# Patient Record
Sex: Female | Born: 1960 | Race: White | Hispanic: No | Marital: Single | Smoking: Current every day smoker
Health system: Southern US, Community
[De-identification: ages and names within clinical notes are randomized; demographics above are authoritative.]

---

## 2009-10-02 ENCOUNTER — Ambulatory Visit: Payer: Self-pay | Admitting: Obstetrics and Gynecology

## 2010-11-28 ENCOUNTER — Ambulatory Visit: Payer: Self-pay | Admitting: Obstetrics and Gynecology

## 2011-12-01 ENCOUNTER — Ambulatory Visit: Payer: Self-pay | Admitting: Obstetrics and Gynecology

## 2012-12-01 ENCOUNTER — Ambulatory Visit: Payer: Self-pay | Admitting: Obstetrics and Gynecology

## 2014-10-23 ENCOUNTER — Ambulatory Visit: Payer: Self-pay | Admitting: Family Medicine

## 2016-02-11 IMAGING — CR DG CHEST 2V
2 series · 2 of 2 positions shown · non-contrast
Comparison: None.

CLINICAL DATA: Shortness of breath. Cough. Chest pain. Emphysema.

EXAM:
CHEST  2 VIEW

[chest pa]
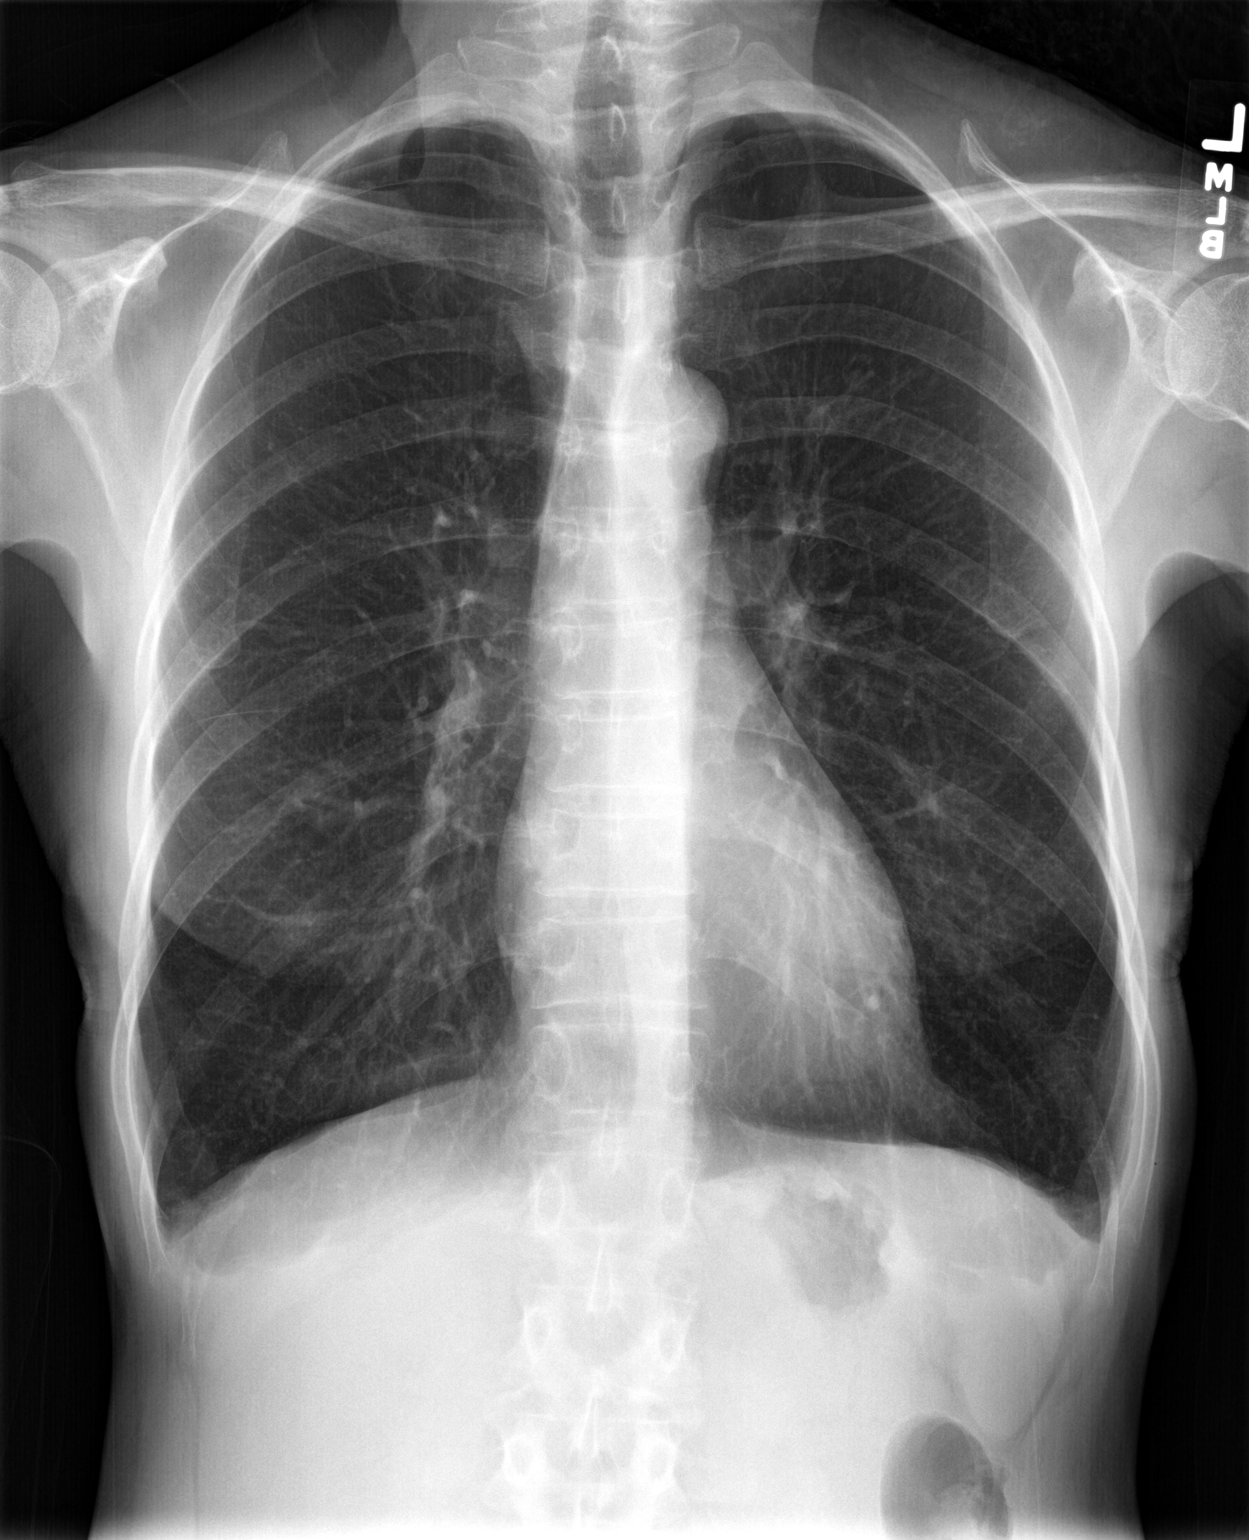

[chest lat]
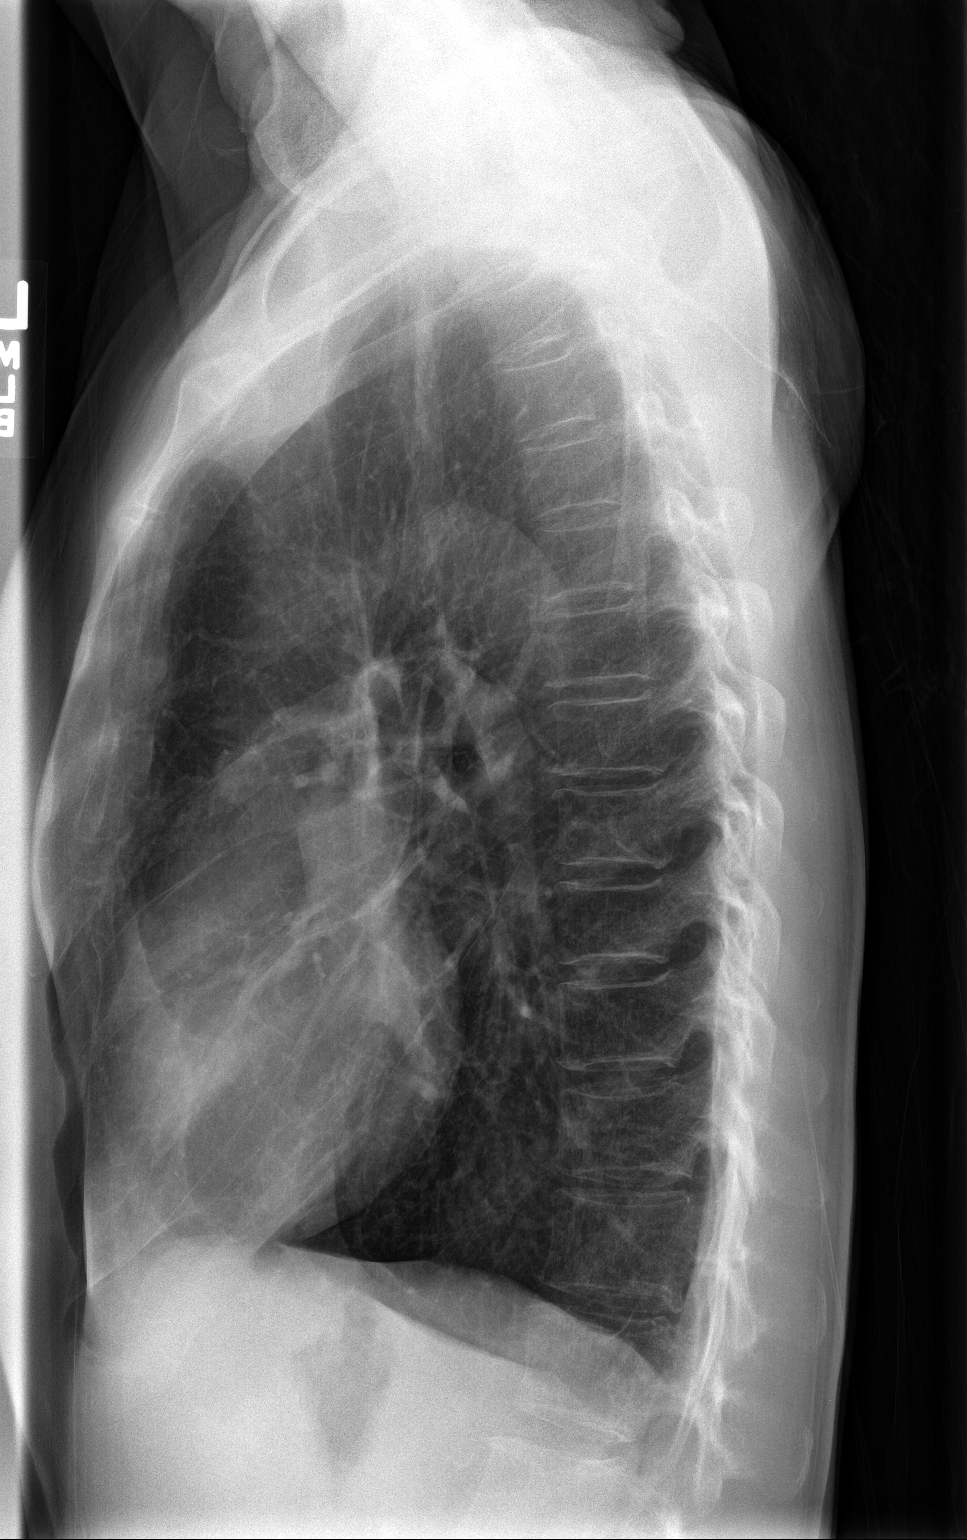

[2 of 2 positions shown; findings below may reference images not displayed]

FINDINGS: The heart size and pulmonary vascularity are normal. The lungs are
hyperinflated with flattening of the diaphragm consistent with
emphysema.

There are no infiltrates or effusions.  No osseous abnormality.
IMPRESSION: No acute abnormality.  Emphysema.

## 2019-03-17 ENCOUNTER — Ambulatory Visit (INDEPENDENT_AMBULATORY_CARE_PROVIDER_SITE_OTHER): Payer: Managed Care, Other (non HMO)

## 2019-03-17 ENCOUNTER — Ambulatory Visit
Admission: EM | Admit: 2019-03-17 | Discharge: 2019-03-17 | Disposition: A | Discharge status: Home / Self Care | Payer: Managed Care, Other (non HMO) | Attending: Urgent Care | Admitting: Urgent Care

## 2019-03-17 ENCOUNTER — Other Ambulatory Visit: Payer: Self-pay

## 2019-03-17 ENCOUNTER — Encounter: Payer: Self-pay | Admitting: Emergency Medicine

## 2019-03-17 DIAGNOSIS — H9203 Otalgia, bilateral: Secondary | ICD-10-CM | POA: Diagnosis not present

## 2019-03-17 DIAGNOSIS — M79671 Pain in right foot: Secondary | ICD-10-CM

## 2019-03-17 DIAGNOSIS — M7731 Calcaneal spur, right foot: Secondary | ICD-10-CM

## 2019-03-17 HISTORY — DX: Calcaneal spur, right foot: M77.31

## 2019-03-17 MED ORDER — METHYLPREDNISOLONE 4 MG PO TBPK: ORAL_TABLET | ORAL | 0 refills | Status: DC

## 2019-03-17 NOTE — ED Provider Notes (Signed)
[place], [place]   Name: Lori Prince DOB: 07/11/1961 MRN: [id number] CSN: [id number] PCP: Patient, No Pcp Per  Arrival date and time:  03/17/19 1018  Chief Complaint:  Otalgia and Foot Pain   NOTE: Prior to seeing the patient today, I have reviewed the triage nursing documentation and vital signs. Clinical staff has updated patient's PMH/PSHx, current medication list, and drug allergies/intolerances to ensure comprehensive history available to assist in medical decision making.   History:   HPI: Lori Prince is a 58 y.o. female who presents today with complaints of BILATERAL ear pain and RIGHT foot pain.   Ears: Pain in her ears started this morning. She denies any associated fevers, however felt like she was having some chills this morning. Patient has not had any other recent upper respiratory symptoms; no cough, congestion, rhinorrhea, sneezing, or sore throat. She denies forceful nose blowing. Patient has not appreciated any otorrhea. She advises that her ability to hear from her ears is normal. Patient denies history of recurrent ear infections. PMH (+) for seasonal allergies, however she does not take daily allergy medication.   Foot: Pain began with acute onset x 2 weeks ago. She notes that the majority of her pain is overlying the posterior aspect of the posterior calcaneous. She denies any injuries to her foot. She has not experienced any swelling. Patient advises that the pain improves after a hot shower. She takes the occasion dose of Tylenol, which has helped "some". She denies any previous injuries to the foot; no previous surgeries.   History reviewed. No pertinent past medical history.  History reviewed. No pertinent surgical history.  History reviewed. No pertinent family history.  Social History   Tobacco Use  . Smoking status: Former Games developermoker  . Smokeless tobacco: Never Used  Substance Use Topics  . Alcohol use: Never    Frequency: Never  . Drug  use: Never    There are no active problems to display for this patient.   Home Medications:    No outpatient medications have been marked as taking for the 03/17/19 encounter [facility](Hospital Encounter).    Allergies:   Patient has no known allergies.  Review of Systems (ROS): Review of Systems  Constitutional: Positive for chills. Negative for fever.  HENT: Positive for ear pain. Negative for congestion, ear discharge, rhinorrhea, sinus pain, sore throat and tinnitus.   Respiratory: Negative for cough and shortness of breath.   Cardiovascular: Negative for chest pain and palpitations.  Musculoskeletal:       RIGHT foot pain  Skin: Negative for color change, pallor and rash.  Neurological: Negative for dizziness, syncope, weakness and headaches.  Hematological: Negative for adenopathy.  All other systems reviewed and are negative.    Vital Signs: Today's Vitals   03/17/19 1110 03/17/19 1112  BP:  136/75  Pulse:  77  Resp:  18  Temp:  98.3 F (36.8 C)  TempSrc:  Oral  SpO2:  97%  Weight:  155 lb (70.3 kg)  Height:  5\' 4"  (1.626 m)  PainSc: 10-Worst pain ever     Physical Exam: Physical Exam  Constitutional: She is oriented to person, place, and time and well-developed, well-nourished, and in no distress.  HENT:  Head: Normocephalic and atraumatic.  Right Ear: Hearing, external ear and ear canal normal. No swelling or tenderness. Tympanic membrane is not injected, not erythematous and not bulging. A middle ear effusion (mild serous) is present.  Left Ear: Hearing, external ear and ear canal normal.  No swelling or tenderness. Tympanic membrane is not injected, not erythematous and not bulging. A middle ear effusion (mild serous) is present.  Nose: No mucosal edema, rhinorrhea or sinus tenderness.  Mouth/Throat: Uvula is midline, oropharynx is clear and moist and mucous membranes are normal.  Cardiovascular: Normal rate, regular rhythm, normal heart sounds and intact distal  pulses. Exam reveals no gallop and no friction rub.  No murmur heard. Pulmonary/Chest: Effort normal and breath sounds normal. No respiratory distress. She has no wheezes. She has no rales.  Musculoskeletal:     Right foot: Decreased range of motion (2/2 pain). Tenderness (marked areas) and swelling (slight) present. No deformity.       Feet:  Neurological: She is alert and oriented to person, place, and time. Gait normal. GCS score is 15.  Skin: Skin is warm and dry. No rash noted.  Psychiatric: Mood, memory, affect and judgment normal.  Nursing note and vitals reviewed.   Urgent Care Treatments / Results:   LABS: PLEASE NOTE: all labs that were ordered this encounter are listed, however only abnormal results are displayed. Labs Reviewed - No data to display  EKG: -None  RADIOLOGY: Dg Foot Complete Right  Result Date: 03/17/2019 CLINICAL DATA:  Plantar and lateral heel pain for 2 weeks with swelling. No known injury. EXAM: RIGHT FOOT COMPLETE - 3+ VIEW COMPARISON:  None. FINDINGS: No fracture or dislocation is identified. There is a small plantar calcaneal enthesophyte. No osseous erosion is identified. The soft tissues are unremarkable. IMPRESSION: No evidence of acute osseous abnormality. Small plantar calcaneal enthesophyte. Electronically Signed   By: Logan Bores M.D.   On: 03/17/2019 11:45   PROCEDURES: Procedures  MEDICATIONS RECEIVED THIS VISIT: Medications - No data to display  PERTINENT CLINICAL COURSE NOTES/UPDATES:   Initial Impression / Assessment and Plan / Urgent Care Course:  Pertinent labs & imaging results that were available during my care of the patient were personally reviewed by me and considered in my medical decision making (see lab/imaging section of note for values and interpretations).  Lori Prince is a 58 y.o. female who presents to [facility] today with complaints of Otalgia and Foot Pain   Patient is well appearing overall in  clinic today. She does not appear to be in any acute distress. Presenting symptoms (see HPI) and exam as documented above. Diagnostic radiographs of the RIGHT foot demonstrate a small plantar enthesophyte corresponding to the area of her pain on the plantar surface of the foot. Her posterior heel pain is also likely related to an insertional enthesitis. Discussed ice and stretching to help improve her pain. Patient to use IBU at home for inflammation. Will prescribe a course of systemic steroids (Medrol) to help with there pain, slight swelling, and overall inflammation. If not improving with theses measures, patient will need to see podiatry to discuss further interventions. Name and office contact information provided on today's AVS for Dr. Albertine Patricia. Patient advised the she will need to contact the office to schedule an appointment to be seen.   Regarding her ears. She has a mild serous effusions to her BILATERAL ears,  which are felt to be allergic in nature. Recommend conservative care with OTC allergy medications such as loratadine or cetirizine. Patient encouraged to remain well hydrated. May use Tylenol and/or Ibuprofen as needed for discomfort. If not improving, she was asked to return a call to the clinic, or to follow up for reassessment with her PCP.   I have  reviewed the follow up and strict return precautions for any new or worsening symptoms. Patient is aware of symptoms that would be deemed urgent/emergent, and would thus require further evaluation either here or in the emergency department. At the time of discharge, she verbalized understanding and consent with the discharge plan as it was reviewed with her. All questions were fielded by provider and/or clinic staff prior to patient discharge.    Final Clinical Impressions / Urgent Care Diagnoses:   Final diagnoses:  Pain of right heel  Otalgia of both ears    New Prescriptions:  [place] Controlled Substance Registry consulted? Not  Applicable  Meds ordered this encounter  Medications  . methylPREDNISolone (MEDROL DOSEPAK) 4 MG TBPK tablet    Sig: Take by mouth daily - taper daily dose per package instructions.    Dispense:  21 tablet    Refill:  0    Recommended Follow up Care:  Patient encouraged to follow up with the following provider within the specified time frame, or sooner as dictated by the severity of her symptoms. As always, she was instructed that for any urgent/emergent care needs, she should seek care either here or in the emergency department for more immediate evaluation.  Follow-up Information    PCP In 1 week.   Why: General reassessment of symptoms if not improving       Call  Troxler, Blanchie ServeMatthew, DPM.   Specialty: Podiatry Why: General reassessment of symptoms if not improving Contact information: [address] [facility] [place] [id number] [phone]         NOTE: This note was prepared using Dragon dictation software along with smaller phrase technology. Despite my best ability to proofread, there is the potential that transcriptional errors may still occur from this process, and are completely unintentional.    Verlee MonteGray, Tereasa Yilmaz E, NP 03/18/19 1742

## 2019-03-17 NOTE — ED Triage Notes (Signed)
Patient c/o right foot pain, heel area x 3-4 weeks. She denies injury. She also reports right ear pain that started yesterday. Patient states she woke up with chills this morning.

## 2019-03-17 NOTE — Discharge Instructions (Signed)
It was very nice seeing you today in clinic. Thank you for entrusting me with your care.   Please utilize the medications that we discussed. Your prescriptions have been called in to your pharmacy.   Make arrangements to follow up with your regular doctor in 1 week for re-evaluation if not improving. I have also given you the name of an excellent local podiatrist to contact if pain in foot is not improving. If your symptoms/condition worsens, please seek follow up care either here or in the ER. Please remember, our [place] providers are "right here with you" when you need [place].   Again, it was my pleasure to take care of you today. Thank you for choosing our clinic. I hope that you start to feel better quickly.   Honor Loh, MSN, APRN, FNP-C, CEN Advanced Practice Provider [facility]

## 2019-10-26 ENCOUNTER — Encounter: Payer: Self-pay | Admitting: Emergency Medicine

## 2019-10-26 ENCOUNTER — Ambulatory Visit
Admission: EM | Admit: 2019-10-26 | Discharge: 2019-10-26 | Disposition: A | Discharge status: Home / Self Care | Payer: Managed Care, Other (non HMO) | Attending: Urgent Care | Admitting: Urgent Care

## 2019-10-26 ENCOUNTER — Ambulatory Visit (INDEPENDENT_AMBULATORY_CARE_PROVIDER_SITE_OTHER): Payer: Managed Care, Other (non HMO)

## 2019-10-26 ENCOUNTER — Other Ambulatory Visit: Payer: Self-pay

## 2019-10-26 DIAGNOSIS — M79672 Pain in left foot: Secondary | ICD-10-CM

## 2019-10-26 MED ORDER — MELOXICAM 15 MG PO TABS: 15.0000 mg | ORAL_TABLET | Freq: Every day | ORAL | 0 refills | Status: AC

## 2019-10-26 NOTE — Discharge Instructions (Addendum)
It was very nice seeing you today in clinic. Thank you for entrusting me with your care.   Rest, ice, and elevate your foot. Use anti-inflammatory medications as prescribed. If not improving, recommend seeing podiatry to discuss further. I have provided you the name and office contact information for an excellent local provider.   If your symptoms/condition worsens, please seek follow up care either here or in the ER. Please remember, our [facility] providers are "right here with you" when you need [place].   Again, it was my pleasure to take care of you today. Thank you for choosing our clinic. I hope that you start to feel better quickly.   Quentin Mulling, MSN, APRN, FNP-C, CEN Advanced Practice Provider [facility] [place] [facility]

## 2019-10-26 NOTE — ED Triage Notes (Signed)
Patient c/o spasms on the bottom of her left foot x 3 weeks. She states her foot is painful when she walks.

## 2019-10-27 ENCOUNTER — Encounter: Payer: Self-pay | Admitting: Urgent Care

## 2019-10-27 NOTE — ED Provider Notes (Signed)
[place], [place]   Name: Lori Prince DOB: [birth year] MRN: [id number] CSN: [id number] PCP: Patient, No Pcp Per  Arrival date and time:  10/26/19 1834  Chief Complaint:  Foot Pain  NOTE: Prior to seeing the patient today, I have reviewed the triage nursing documentation and vital signs. Clinical staff has updated patient's PMH/PSHx, current medication list, and drug allergies/intolerances to ensure comprehensive history available to assist in medical decision making.   History:   HPI: Lori Prince is a 59 y.o. female who presents today with complaints of atraumatic pain to her LEFT foot. Pain has been worsening over the course of the last 3 weeks. Patient stands and walks a great deal at her job. She notes that pain is worse with prolonged standing and distance ambulation. Pain is described as a "spasm" type pain overlying the distal aspects of metatarsals three, four, and five. She denies erythema, ecchymosis, swelling, or warmth. Of note, patient reports blunt force trauma injury about 3-4 months ago whereby she dropped a vacuum on her foot. While her foot was bruised and painful for quite sometime, she did not seek medical attention at that time. Patient denies previous significant injuries to her foot or ankle; no surgeries. In efforts to conservatively manage her symptoms at home, the patient notes that she has used Aleve, which has not helped to improve her symptoms. Patient has a known calcaneal (plantar) spur contralaterally.   Past Medical History:  Diagnosis Date  . 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 No pertinent surgical history.  History reviewed. No pertinent family history.  Social History   Tobacco Use  . Smoking status: Former Research scientist (life sciences)  . Smokeless tobacco: Never Used  Substance Use Topics  . Alcohol use: Never  . Drug use: Never    There are no problems to display for this patient.   Home Medications:    No outpatient  medications have been marked as taking for the 10/26/19 encounter [facility] Encounter).    Allergies:   Patient has no known allergies.  Review of Systems (ROS):  Review of systems NEGATIVE unless otherwise noted in narrative H&P section.   Vital Signs: Today's Vitals   10/26/19 1848 10/26/19 1849 10/26/19 1938  BP:  (!) 148/75   Pulse:  65   Resp:  18   Temp:  97.9 F (36.6 C)   TempSrc:  Oral   SpO2:  100%   Weight: 150 lb (68 kg)    Height: 5\' 3"  (1.6 m)    PainSc: 10-Worst pain ever  10-Worst pain ever    Physical Exam: Physical Exam  Constitutional: She is oriented to person, place, and time and well-developed, well-nourished, and in no distress.  HENT:  Head: Normocephalic and atraumatic.  Eyes: Pupils are equal, round, and reactive to light.  Cardiovascular: Normal rate, regular rhythm, normal heart sounds and intact distal pulses.  Pulmonary/Chest: Effort normal and breath sounds normal.  Musculoskeletal:     Left foot: Normal range of motion and normal capillary refill. Tenderness (see marked location) present. No swelling, deformity, bony tenderness or crepitus.       Feet:  Neurological: She is alert and oriented to person, place, and time. Gait (2/2 foot pain) abnormal.  Skin: Skin is warm and dry. No rash noted. She is not diaphoretic.  Psychiatric: Mood, memory, affect and judgment normal.  Nursing note and vitals reviewed.   Urgent Care Treatments / Results:   Orders Placed This Encounter  Procedures  . DG Foot Complete Left    LABS: PLEASE NOTE: all labs that were ordered this encounter are listed, however only abnormal results are displayed. Labs Reviewed - No data to display  EKG: -None  RADIOLOGY: DG Foot Complete Left  Result Date: 10/26/2019 CLINICAL DATA:  Pain and swelling along the dorsal aspect of the left foot. EXAM: LEFT FOOT - COMPLETE 3+ VIEW COMPARISON:  None. FINDINGS: There is no evidence of fracture or dislocation. There is  no evidence of arthropathy or other focal bone abnormality. Soft tissues are unremarkable. IMPRESSION: Negative. Electronically Signed   By: Aram Candela M.D.   On: 10/26/2019 19:26    PROCEDURES: Procedures  MEDICATIONS RECEIVED THIS VISIT: Medications - No data to display  PERTINENT CLINICAL COURSE NOTES/UPDATES:   Initial Impression / Assessment and Plan / Urgent Care Course:  Pertinent labs & imaging results that were available during my care of the patient were personally reviewed by me and considered in my medical decision making (see lab/imaging section of note for values and interpretations).  Lori Prince is a 59 y.o. female who presents to [facility] today with complaints of Foot Pain  Patient is well appearing overall in clinic today. She does not appear to be in any acute distress. Presenting symptoms (see HPI) and exam as documented above. Pain present x 3 weeks. She denies injury. Diagnostic radiographs of the LEFT foot revealed no acute abnormalities; no fracture, dislocation, or effusion. There is no clear etiology for patient's foot pain. Suspect metatarsalgia secondary to prolonged standing and distance ambulation. Recommended rest, ice, and elevation. Will treat with NSAIDs (meloxicam). If not improving with conservative management, patient will need to be seen for further evaluation by podiatry. Name and office contact information provided on today's AVS for Dr. Recardo Evangelist. Patient advised the she will need to contact the office to schedule an appointment to be seen.   Current clinical condition warrants patient being out of work in order to recover from her current injury/illness. She reports that she does not wish to be able of work. She was provided with the appropriate documentation to provide to her place of employment that will allow for her to RTW on 10/27/2019 with no restrictions.   I have reviewed the follow up and strict return precautions  for any new or worsening symptoms. Patient is aware of symptoms that would be deemed urgent/emergent, and would thus require further evaluation either here or in the emergency department. At the time of discharge, she verbalized understanding and consent with the discharge plan as it was reviewed with her. All questions were fielded by provider and/or clinic staff prior to patient discharge.    Final Clinical Impressions / Urgent Care Diagnoses:   Final diagnoses:  Foot pain, left    New Prescriptions:  [place] Controlled Substance Registry consulted? Not Applicable  Meds ordered this encounter  Medications  . meloxicam (MOBIC) 15 MG tablet    Sig: Take 1 tablet (15 mg total) by mouth daily.    Dispense:  30 tablet    Refill:  0    Recommended Follow up Care:  Patient encouraged to follow up with the following provider within the specified time frame, or sooner as dictated by the severity of her symptoms. As always, she was instructed that for any urgent/emergent care needs, she should seek care either here or in the emergency department for more immediate evaluation.  Follow-up Information    Troxler, Molli Hazard, DPM In  1 week.   Specialty: Podiatry Why: General reassessment of symptoms if not improving Contact information: [address] [facility] [place] [address] [phone]         NOTE: This note was prepared using Dragon dictation software along with smaller phrase technology. Despite my best ability to proofread, there is the potential that transcriptional errors may still occur from this process, and are completely unintentional.    Verlee Monte, NP 10/27/19 [phone]

## 2021-02-13 IMAGING — CR DG FOOT COMPLETE 3+V*L*
3 series · 3 of 3 positions shown · non-contrast
Comparison: None.

CLINICAL DATA: Pain and swelling along the dorsal aspect of the
left foot.

EXAM:
LEFT FOOT - COMPLETE 3+ VIEW

[foot ap]
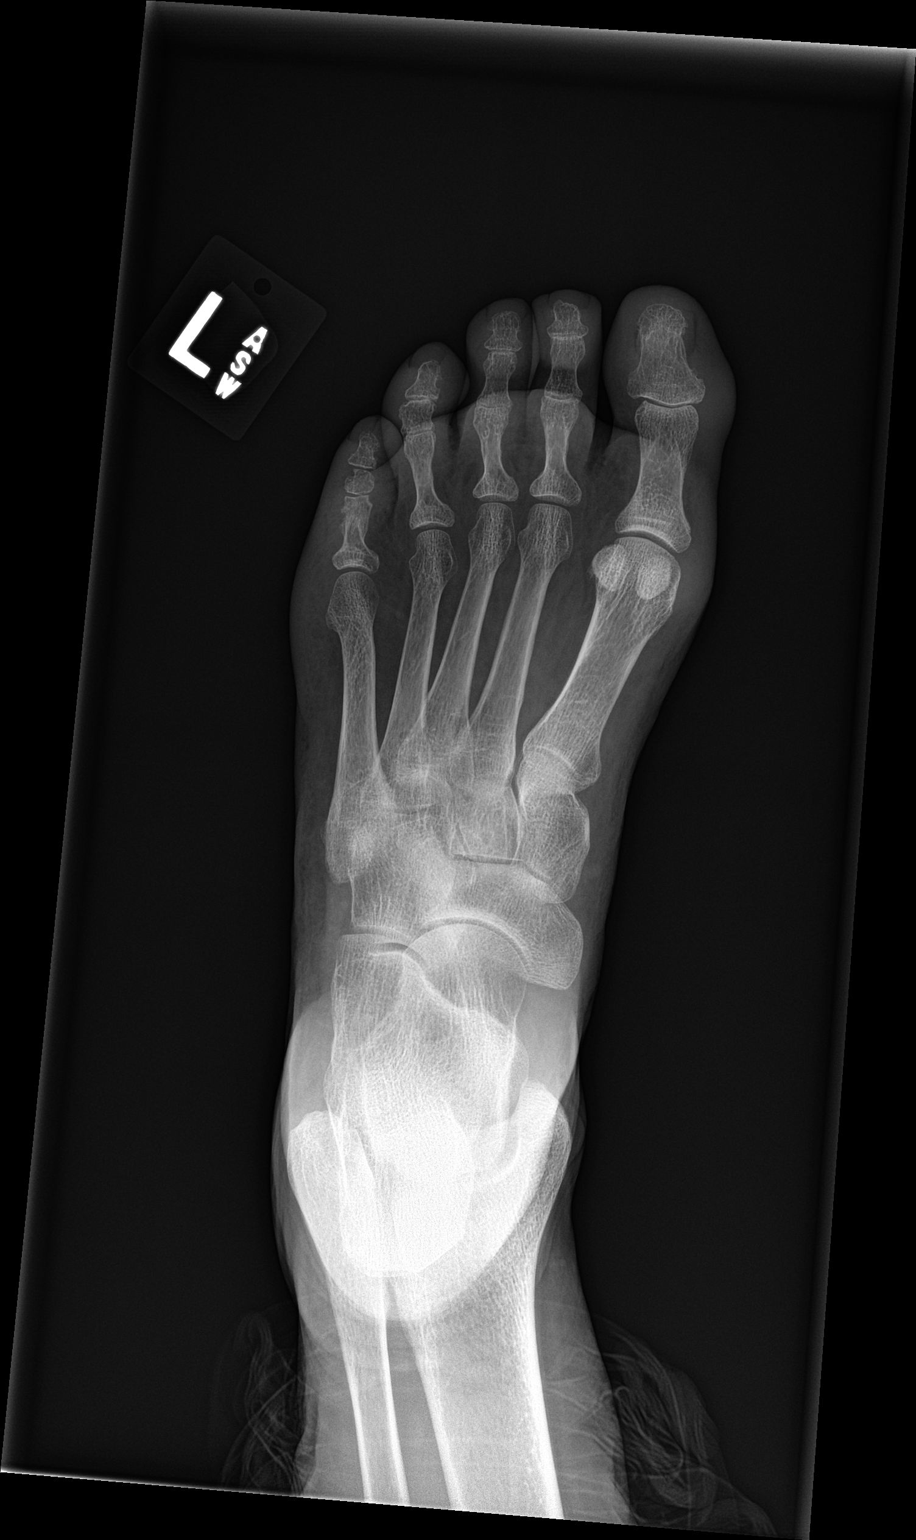

[foot obl]
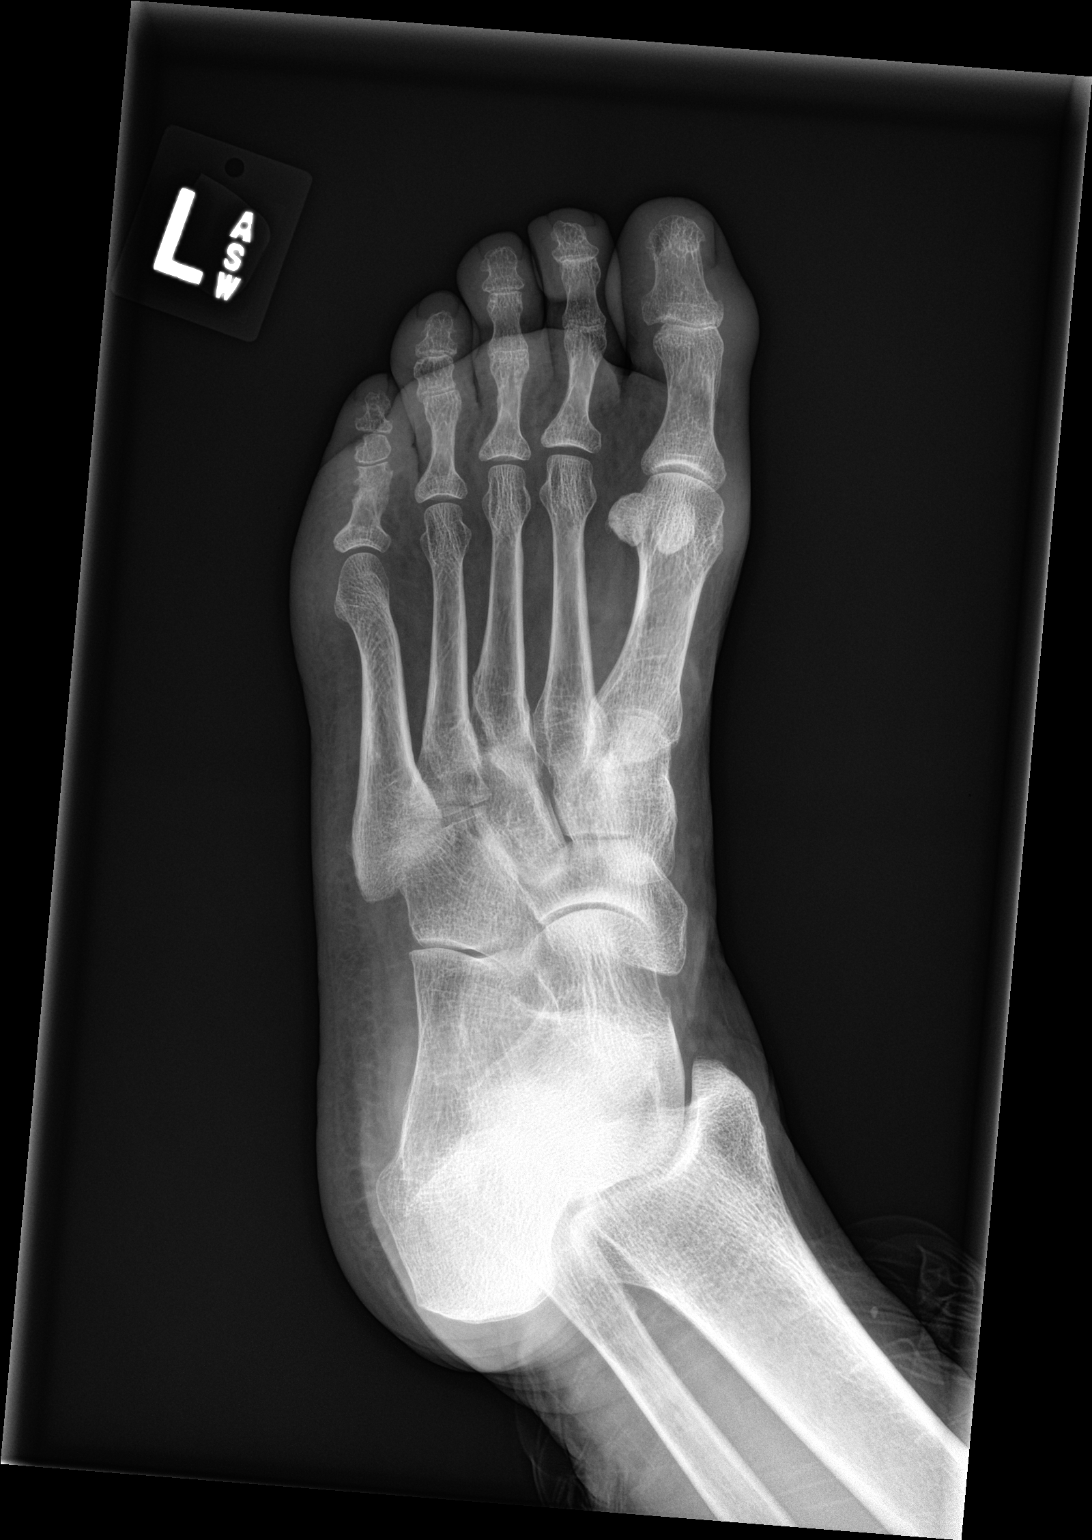

[foot lat]
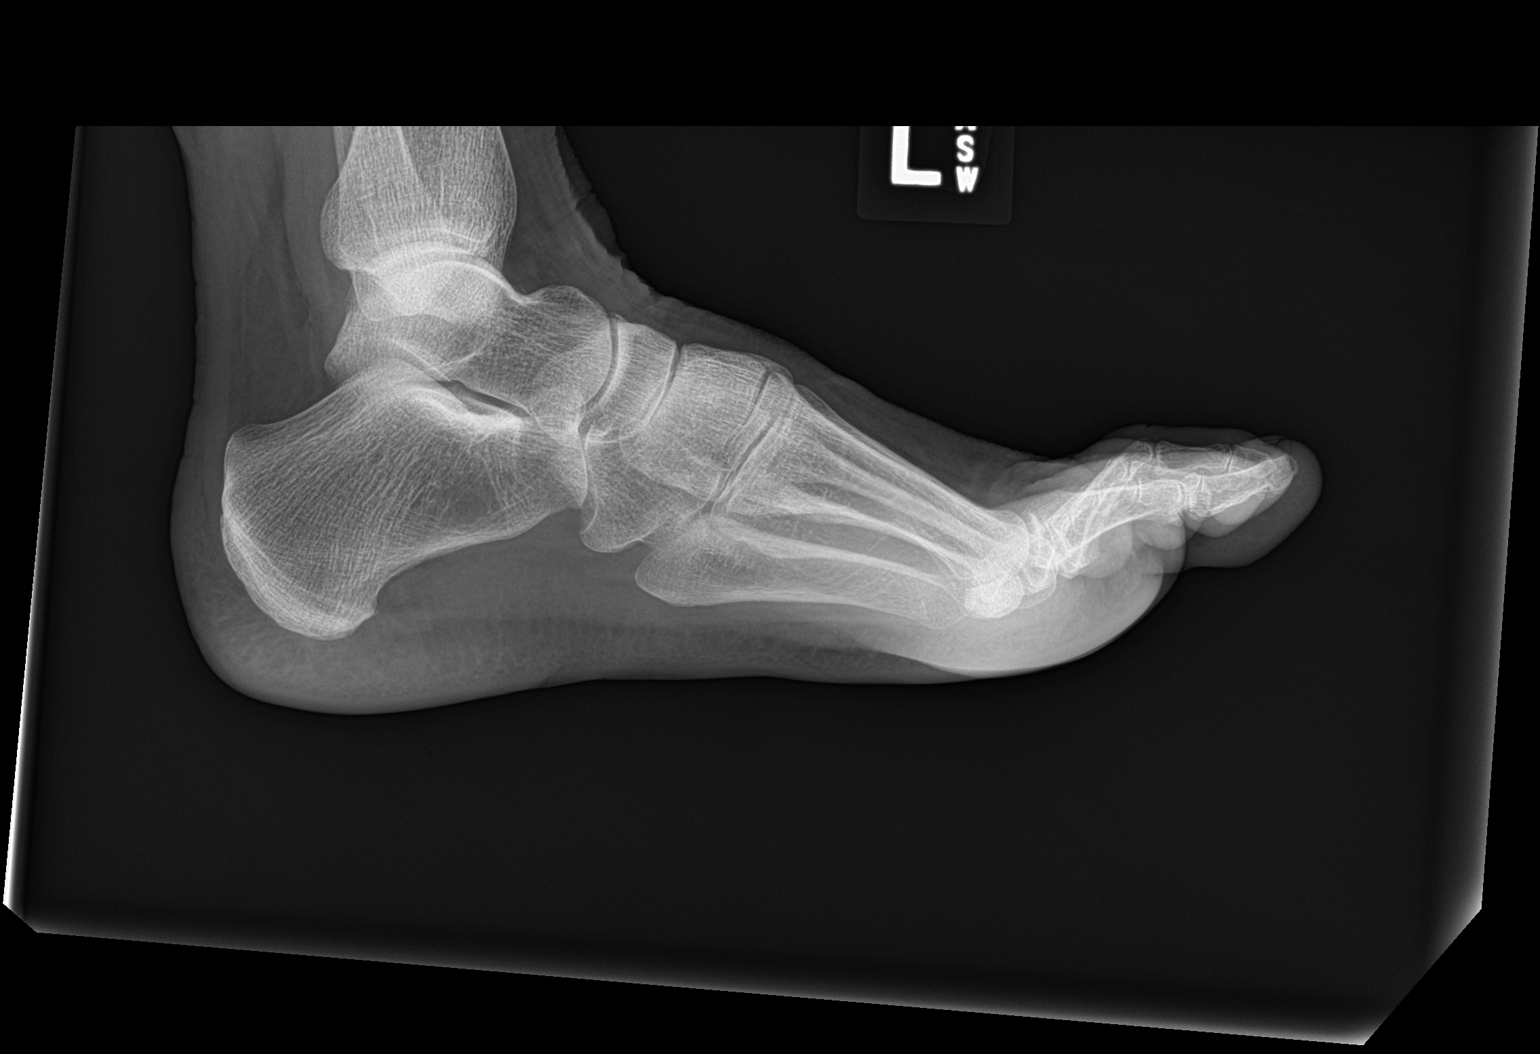

[3 of 3 positions shown; findings below may reference images not displayed]

FINDINGS: There is no evidence of fracture or dislocation. There is no
evidence of arthropathy or other focal bone abnormality. Soft
tissues are unremarkable.
IMPRESSION: Negative.

## 2022-04-09 ENCOUNTER — Ambulatory Visit
Admission: EM | Admit: 2022-04-09 | Discharge: 2022-04-09 | Disposition: A | Discharge status: Home / Self Care | Payer: Managed Care, Other (non HMO) | Attending: Family Medicine | Admitting: Family Medicine

## 2022-04-09 ENCOUNTER — Other Ambulatory Visit: Payer: Self-pay

## 2022-04-09 ENCOUNTER — Encounter: Payer: Self-pay | Admitting: Emergency Medicine

## 2022-04-09 DIAGNOSIS — H538 Other visual disturbances: Secondary | ICD-10-CM

## 2022-04-09 DIAGNOSIS — N3000 Acute cystitis without hematuria: Secondary | ICD-10-CM | POA: Insufficient documentation

## 2022-04-09 DIAGNOSIS — R634 Abnormal weight loss: Secondary | ICD-10-CM

## 2022-04-09 LAB — CBC WITH DIFFERENTIAL/PLATELET
Abs Immature Granulocytes: 0.02 10*3/uL (ref 0.00–0.07)
Basophils Absolute: 0.1 10*3/uL (ref 0.0–0.1)
Basophils Relative: 1 %
Eosinophils Absolute: 0.2 10*3/uL (ref 0.0–0.5)
Eosinophils Relative: 2 %
HCT: 43.6 % (ref 36.0–46.0)
Hemoglobin: 14.8 g/dL (ref 12.0–15.0)
Immature Granulocytes: 0 %
Lymphocytes Relative: 33 %
Lymphs Abs: 2.7 10*3/uL (ref 0.7–4.0)
MCH: 31.7 pg (ref 26.0–34.0)
MCHC: 33.9 g/dL (ref 30.0–36.0)
MCV: 93.4 fL (ref 80.0–100.0)
Monocytes Absolute: 0.4 10*3/uL (ref 0.1–1.0)
Monocytes Relative: 5 %
Neutro Abs: 4.7 10*3/uL (ref 1.7–7.7)
Neutrophils Relative %: 59 %
Platelets: 231 10*3/uL (ref 150–400)
RBC: 4.67 MIL/uL (ref 3.87–5.11)
RDW: 11.9 % (ref 11.5–15.5)
WBC: 8.1 10*3/uL (ref 4.0–10.5)
nRBC: 0 % (ref 0.0–0.2)

## 2022-04-09 LAB — COMPREHENSIVE METABOLIC PANEL
ALT: 19 U/L (ref 0–44)
AST: 22 U/L (ref 15–41)
Albumin: 4.6 g/dL (ref 3.5–5.0)
Alkaline Phosphatase: 94 U/L (ref 38–126)
Anion gap: 7 (ref 5–15)
BUN: 19 mg/dL (ref 8–23)
CO2: 28 mmol/L (ref 22–32)
Calcium: 9.3 mg/dL (ref 8.9–10.3)
Chloride: 105 mmol/L (ref 98–111)
Creatinine, Ser: 0.84 mg/dL (ref 0.44–1.00)
GFR, Estimated: >60 mL/min (ref >60)
Glucose, Bld: 106 mg/dL — ABNORMAL HIGH (ref 70–99)
Potassium: 4.1 mmol/L (ref 3.5–5.1)
Sodium: 140 mmol/L (ref 135–145)
Total Bilirubin: 0.9 mg/dL (ref 0.3–1.2)
Total Protein: 7.9 g/dL (ref 6.5–8.1)

## 2022-04-09 LAB — GLUCOSE, CAPILLARY: Glucose-Capillary: 88 mg/dL (ref 70–99)

## 2022-04-09 LAB — URINALYSIS, ROUTINE W REFLEX MICROSCOPIC
Bilirubin Urine: NEGATIVE
Glucose, UA: NEGATIVE mg/dL
Hgb urine dipstick: NEGATIVE
Ketones, ur: NEGATIVE mg/dL
Nitrite: POSITIVE — AB
Protein, ur: NEGATIVE mg/dL
Specific Gravity, Urine: 1.025 (ref 1.005–1.030)
pH: 5.5 (ref 5.0–8.0)

## 2022-04-09 LAB — URINALYSIS, MICROSCOPIC (REFLEX)

## 2022-04-09 MED ORDER — CEPHALEXIN 500 MG PO CAPS: 500.0000 mg | ORAL_CAPSULE | Freq: Four times a day (QID) | ORAL | 0 refills | Status: AC

## 2022-04-09 NOTE — Discharge Instructions (Addendum)
Some of the pharmacy to pick up your antibiotics.  It is very important that you establish care with a primary care doctor.  Someone should call you to schedule an appointment in about 2 weeks.  You will need to get your cancer screenings (Pap smear, colonoscopy, CT for lung cancer and mammogram) updated.

## 2022-04-09 NOTE — ED Provider Notes (Signed)
[facility]    CSN: [id number] Arrival date & time: 04/09/22  1731      History   Chief Complaint Chief Complaint  Patient presents with   Urinary Frequency   Fatigue    HPI Lori Prince is a 61 y.o. female.   HPI  Patient reports blurry vision for the past 6 months and yesterday she got to the point where she couldn't hardly see. Sx worse with driving and can occur when she is walking around the house. Blurry vision is not constant. She wears reading glasses, she hasn't seen an eye doctor for 3+ years. She is not having many headaches and when she does she attributed them to having really long hair (which is heavy wrapped in a bun on the top of her head, per pt).   Says she lost a lot of weight, 10-15 in the last 6 months. She has not been trying to lose weight.  She has a good appetite but only eats once a day. She works 12 hours and tries not to eat before she lays down. She has snacks at work but no meals. She has been working this schedule for the past 2 years.  She last ate last night before she went to bed. She has no chest pain, abdominal pain.   Has night sweats every now and then which have been ongoing since menopause. She smokes 2 packs a week for the last 3-4 months. She previously smoked a pack a day since she was 12. She quit for 4 years but restarted 2 years ago. She has COPD. She has had a at times productive cough for the past year.  She has not had a recent PAP smear or mammogram and has never had a colonoscopy.   She has urinary frequency especially at night. No dysuria, hematuria, hematochezia, melena, vomiting, diarrhea, vaginal discharge or odor. She is sexually active and does not use condoms regularly. No known STD exposures.   She has numbness in her toes for the past for about 2 years.  It is intermittent and she has not noticed a change when it cold.  Has no history of diabetes.  She does not have a primary care doctor and has not seen a  regular doctor in a while.    Past Medical History:  Diagnosis Date   Calcaneal spur of foot, right 03/17/2019    There are no problems to display for this patient.   History reviewed. No pertinent surgical history.  OB History   No obstetric history on file.      Home Medications    Prior to Admission medications   Medication Sig Start Date End Date Taking? Authorizing Provider  cephALEXin (KEFLEX) 500 MG capsule Take 1 capsule (500 mg total) by mouth 4 (four) times daily for 5 days. 04/09/22 04/14/22 Yes Darryl Blumenstein, DO  meloxicam (MOBIC) 15 MG tablet Take 1 tablet (15 mg total) by mouth daily. 10/26/19   Verlee Monte, NP    Family History No family history on file.  Social History Social History   Tobacco Use   Smoking status: Every Day    Types: Cigarettes   Smokeless tobacco: Never  Vaping Use   Vaping Use: Never used  Substance Use Topics   Alcohol use: Never   Drug use: Never     Allergies   Patient has no known allergies.   Review of Systems Review of Systems :negative unless otherwise stated in HPI.  Physical Exam Triage Vital Signs ED Triage Vitals  Enc Vitals Group     BP 04/09/22 1753 113/75     Pulse Rate 04/09/22 1753 62     Resp 04/09/22 1753 16     Temp 04/09/22 1753 98.1 F (36.7 C)     Temp Source 04/09/22 1753 Oral     SpO2 04/09/22 1753 100 %     Weight 04/09/22 1752 125 lb (56.7 kg)     Height 04/09/22 1752 5\' 3"  (1.6 m)     Head Circumference --      Peak Flow --      Pain Score 04/09/22 1750 0     Pain Loc --      Pain Edu? --      Excl. in GC? --    No data found.  Updated Vital Signs BP 113/75 (BP Location: Left Arm)   Pulse 62   Temp 98.1 F (36.7 C) (Oral)   Resp 16   Ht 5\' 3"  (1.6 m)   Wt 56.7 kg   SpO2 100%   BMI 22.14 kg/m   Visual Acuity Right Eye Distance:   Left Eye Distance:   Bilateral Distance:    Right Eye Near:   Left Eye Near:    Bilateral Near:     Physical Exam  GEN:      alert, older female and no distress    HENT:  mucus membranes moist, oropharyngeal without lesions or erythema,  nares patent, no nasal discharge, atraumatic, hair up in the bone EYES:   pupils equal and reactive to light and accommodation, EOM intact, symmetric corneal reflex, no scleral injection or hemorrhage NECK:  supple, normal ROM, no lymphadenopathy  RESP:  clear to auscultation bilaterally, no increased work of breathing  CVS:   regular rate and rhythm, no murmur, distal pulses intact   ABD:  soft, non-tender; bowel sounds present; no palpable masses EXT:   normal ROM, atraumatic, no edema  NEURO:  speech normal, alert and oriented, cranial nerves II through XII grossly intact, gross sensation intact including toes on left foot, no facial droop, normal tone Skin:   warm and dry, no rash, normal skin turgor Psych: Normal affect, appropriate speech and behavior     UC Treatments / Results  Labs (all labs ordered are listed, but only abnormal results are displayed) Labs Reviewed  URINALYSIS, ROUTINE W REFLEX MICROSCOPIC - Abnormal; Notable for the following components:      Result Value   APPearance HAZY (*)    Nitrite POSITIVE (*)    Leukocytes,Ua TRACE (*)    All other components within normal limits  URINALYSIS, MICROSCOPIC (REFLEX) - Abnormal; Notable for the following components:   Bacteria, UA MANY (*)    All other components within normal limits  COMPREHENSIVE METABOLIC PANEL - Abnormal; Notable for the following components:   Glucose, Bld 106 (*)    All other components within normal limits  URINE CULTURE  GLUCOSE, CAPILLARY  CBC WITH DIFFERENTIAL/PLATELET  HIV ANTIBODY (ROUTINE TESTING W REFLEX)  RPR  HEMOGLOBIN A1C  CBG MONITORING, ED  CERVICOVAGINAL ANCILLARY ONLY    EKG   Radiology No results found.  Procedures Procedures (including critical care time)  Medications Ordered in UC Medications - No data to display  Initial Impression / Assessment and  Plan / UC Course  I have reviewed the triage vital signs and the nursing notes.  Pertinent labs & imaging results that were available during my care of  the patient were reviewed by me and considered in my medical decision making (see chart for details).     Patient is a 61 year old female who presents for urinary frequency, blurry vision and left toe numbness.  Of note, she reports losing 15 pounds unintentionally.  She is not up-to-date on her cancer screenings.  On chart review, her last mammogram appears to be in 2014.  She has a greater than 40-year pack a day smoker.  She has not had a low-dose lung CT.  She has never had a colonoscopy.  I am unable to see when her last Pap smear was. Her CBC is without concern for infection or anemia.  There are no platelet abnormalities.  CMP is grossly unremarkable.  Etiology of patient's unintended weight loss is unclear at this time.  Discussed that it is pertinent that she follow-up with a primary care doctor.  Overall, patient is well-appearing and afebrile.  Vital signs stable.  UA consistent with pyuria.  Hematuria not supported on microscopy.  Treat with Keflex 4 times daily for 5 days.  Urine culture obtained.  Follow-up sensitivities and change antibiotics, if needed. Recommended STD testing and she was agreeable.  HIV, RPR and self swab collected and sent to lab.   Regarding her toe numbness: Her point-of-care glucose and glucose on CMP was not abnormal.  Hemoglobin A1c collected due to concern for neuropathy.  On exam, gross sensation is intact.    ED and return precautions provided and patient voiced understanding.  She is to schedule an appointment with a primary care provider and get up-to-date with her cancer screenings.    Final Clinical Impressions(s) / UC Diagnoses   Final diagnoses:  Blurry vision, bilateral  Acute cystitis without hematuria  Unintended weight loss     Discharge Instructions      Some of the pharmacy to pick up  your antibiotics.  It is very important that you establish care with a primary care doctor.  Someone should call you to schedule an appointment in about 2 weeks.  You will need to get your cancer screenings (Pap smear, colonoscopy, CT for lung cancer and mammogram) updated.      ED Prescriptions     Medication Sig Dispense Auth. Provider   cephALEXin (KEFLEX) 500 MG capsule Take 1 capsule (500 mg total) by mouth 4 (four) times daily for 5 days. 20 capsule Katha Cabal, DO      PDMP not reviewed this encounter.   Katha Cabal, DO 04/10/22 [phone]

## 2022-04-09 NOTE — ED Triage Notes (Signed)
Pt c/o urinary frequency, blurred vision, numbness in left toes (3,4,5). Started about 6 months ago. She states it is just getting worse. Pt states she has also lost a lot of weight.

## 2022-04-10 ENCOUNTER — Encounter: Payer: Self-pay | Admitting: Emergency Medicine

## 2022-04-10 LAB — CERVICOVAGINAL ANCILLARY ONLY
Chlamydia: NEGATIVE
Comment: NEGATIVE
Comment: NEGATIVE
Comment: NORMAL
Neisseria Gonorrhea: NEGATIVE
Trichomonas: NEGATIVE

## 2022-04-10 LAB — HEMOGLOBIN A1C
Hgb A1c MFr Bld: 5.5 % (ref 4.8–5.6)
Mean Plasma Glucose: 111.15 mg/dL

## 2022-04-10 LAB — HIV ANTIBODY (ROUTINE TESTING W REFLEX): HIV Screen 4th Generation wRfx: NONREACTIVE

## 2022-04-10 LAB — RPR: RPR Ser Ql: NONREACTIVE

## 2022-04-12 LAB — URINE CULTURE: Culture: >=100000 — AB
